# Patient Record
Sex: Male | Born: 1985 | Race: White | Hispanic: No | Marital: Single
Health system: Southern US, Community
[De-identification: ages and names within clinical notes are randomized; demographics above are authoritative.]

---

## 2021-03-27 ENCOUNTER — Emergency Department (HOSPITAL_COMMUNITY)
Admission: EM | Admit: 2021-03-27 | Discharge: 2021-03-28 | Disposition: A | Discharge status: Home / Self Care | Payer: Self-pay | Attending: Emergency Medicine | Admitting: Emergency Medicine

## 2021-03-27 ENCOUNTER — Encounter (HOSPITAL_COMMUNITY): Payer: Self-pay

## 2021-03-27 ENCOUNTER — Other Ambulatory Visit: Payer: Self-pay

## 2021-03-27 DIAGNOSIS — R4182 Altered mental status, unspecified: Secondary | ICD-10-CM | POA: Insufficient documentation

## 2021-03-27 DIAGNOSIS — Z7982 Long term (current) use of aspirin: Secondary | ICD-10-CM | POA: Insufficient documentation

## 2021-03-27 DIAGNOSIS — F1990 Other psychoactive substance use, unspecified, uncomplicated: Secondary | ICD-10-CM

## 2021-03-27 DIAGNOSIS — F1520 Other stimulant dependence, uncomplicated: Secondary | ICD-10-CM | POA: Insufficient documentation

## 2021-03-27 LAB — COMPREHENSIVE METABOLIC PANEL
ALT: 71 U/L — ABNORMAL HIGH (ref 0–44)
AST: 76 U/L — ABNORMAL HIGH (ref 15–41)
Albumin: 4.7 g/dL (ref 3.5–5.0)
Alkaline Phosphatase: 76 U/L (ref 38–126)
Anion gap: 16 — ABNORMAL HIGH (ref 5–15)
BUN: 17 mg/dL (ref 6–20)
CO2: 25 mmol/L (ref 22–32)
Calcium: 10.1 mg/dL (ref 8.9–10.3)
Chloride: 102 mmol/L (ref 98–111)
Creatinine, Ser: 1.71 mg/dL — ABNORMAL HIGH (ref 0.61–1.24)
GFR, Estimated: 54 mL/min — ABNORMAL LOW (ref >60)
Glucose, Bld: 100 mg/dL — ABNORMAL HIGH (ref 70–99)
Potassium: 3.6 mmol/L (ref 3.5–5.1)
Sodium: 143 mmol/L (ref 135–145)
Total Bilirubin: 0.9 mg/dL (ref 0.3–1.2)
Total Protein: 8.1 g/dL (ref 6.5–8.1)

## 2021-03-27 LAB — CBC WITH DIFFERENTIAL/PLATELET
Abs Immature Granulocytes: 0.07 10*3/uL (ref 0.00–0.07)
Basophils Absolute: 0.1 10*3/uL (ref 0.0–0.1)
Basophils Relative: 0 %
Eosinophils Absolute: 0 10*3/uL (ref 0.0–0.5)
Eosinophils Relative: 0 %
HCT: 48.5 % (ref 39.0–52.0)
Hemoglobin: 16.7 g/dL (ref 13.0–17.0)
Immature Granulocytes: 0 %
Lymphocytes Relative: 13 %
Lymphs Abs: 2 10*3/uL (ref 0.7–4.0)
MCH: 33 pg (ref 26.0–34.0)
MCHC: 34.4 g/dL (ref 30.0–36.0)
MCV: 95.8 fL (ref 80.0–100.0)
Monocytes Absolute: 1.3 10*3/uL — ABNORMAL HIGH (ref 0.1–1.0)
Monocytes Relative: 8 %
Neutro Abs: 12.5 10*3/uL — ABNORMAL HIGH (ref 1.7–7.7)
Neutrophils Relative %: 79 %
Platelets: 562 10*3/uL — ABNORMAL HIGH (ref 150–400)
RBC: 5.06 MIL/uL (ref 4.22–5.81)
RDW: 12.5 % (ref 11.5–15.5)
WBC: 15.9 10*3/uL — ABNORMAL HIGH (ref 4.0–10.5)
nRBC: 0 % (ref 0.0–0.2)

## 2021-03-27 LAB — SALICYLATE LEVEL: Salicylate Lvl: <7 mg/dL — ABNORMAL LOW (ref 7.0–30.0)

## 2021-03-27 LAB — ACETAMINOPHEN LEVEL: Acetaminophen (Tylenol), Serum: <10 ug/mL — ABNORMAL LOW (ref 10–30)

## 2021-03-27 MED ORDER — LORAZEPAM 2 MG/ML IJ SOLN: 2.0000 mg | Freq: Once | INTRAMUSCULAR | Status: DC

## 2021-03-27 MED ORDER — RISPERIDONE 2 MG PO TBDP
2.0000 mg | ORAL_TABLET | Freq: Every day | ORAL | Status: DC
  Filled 2021-03-27: qty 1

## 2021-03-27 MED ORDER — LORAZEPAM 1 MG PO TABS
2.0000 mg | ORAL_TABLET | Freq: Once | ORAL | Status: AC
  Administered 2021-03-27: 2 mg via ORAL
  Filled 2021-03-27: qty 2

## 2021-03-27 MED ORDER — DIPHENHYDRAMINE HCL 50 MG/ML IJ SOLN
50.0000 mg | Freq: Once | INTRAMUSCULAR | Status: AC
  Administered 2021-03-27: 50 mg via INTRAMUSCULAR
  Filled 2021-03-27: qty 1

## 2021-03-27 MED ORDER — ZIPRASIDONE MESYLATE 20 MG IM SOLR: 20.0000 mg | Freq: Once | INTRAMUSCULAR | Status: DC

## 2021-03-27 NOTE — ED Notes (Signed)
Pt given turkey sandwich and cola per RN. 

## 2021-03-27 NOTE — ED Notes (Signed)
Provider at bedside

## 2021-03-27 NOTE — ED Notes (Signed)
Belongings placed in locker 2 in purple side

## 2021-03-27 NOTE — ED Notes (Signed)
Pt up to nursing desk requesting more water and talking to staff

## 2021-03-27 NOTE — ED Notes (Signed)
Received verbal report from Brittany RN at this time 
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
## 2021-03-28 ENCOUNTER — Emergency Department (HOSPITAL_COMMUNITY)
Admission: EM | Admit: 2021-03-28 | Discharge: 2021-03-29 | Disposition: A | Discharge status: Home / Self Care | Payer: Self-pay | Attending: Emergency Medicine | Admitting: Emergency Medicine

## 2021-03-28 DIAGNOSIS — Y900 Blood alcohol level of less than 20 mg/100 ml: Secondary | ICD-10-CM | POA: Insufficient documentation

## 2021-03-28 DIAGNOSIS — F10929 Alcohol use, unspecified with intoxication, unspecified: Secondary | ICD-10-CM

## 2021-03-28 DIAGNOSIS — F1510 Other stimulant abuse, uncomplicated: Secondary | ICD-10-CM | POA: Insufficient documentation

## 2021-03-28 DIAGNOSIS — F10129 Alcohol abuse with intoxication, unspecified: Secondary | ICD-10-CM | POA: Insufficient documentation

## 2021-03-28 LAB — CBC WITH DIFFERENTIAL/PLATELET
Abs Immature Granulocytes: 0.03 10*3/uL (ref 0.00–0.07)
Basophils Absolute: 0 10*3/uL (ref 0.0–0.1)
Basophils Relative: 0 %
Eosinophils Absolute: 0.1 10*3/uL (ref 0.0–0.5)
Eosinophils Relative: 1 %
HCT: 44.6 % (ref 39.0–52.0)
Hemoglobin: 15.5 g/dL (ref 13.0–17.0)
Immature Granulocytes: 0 %
Lymphocytes Relative: 13 %
Lymphs Abs: 1.3 10*3/uL (ref 0.7–4.0)
MCH: 32.6 pg (ref 26.0–34.0)
MCHC: 34.8 g/dL (ref 30.0–36.0)
MCV: 93.9 fL (ref 80.0–100.0)
Monocytes Absolute: 0.6 10*3/uL (ref 0.1–1.0)
Monocytes Relative: 6 %
Neutro Abs: 8.5 10*3/uL — ABNORMAL HIGH (ref 1.7–7.7)
Neutrophils Relative %: 80 %
Platelets: 450 10*3/uL — ABNORMAL HIGH (ref 150–400)
RBC: 4.75 MIL/uL (ref 4.22–5.81)
RDW: 12.3 % (ref 11.5–15.5)
WBC: 10.5 10*3/uL (ref 4.0–10.5)
nRBC: 0 % (ref 0.0–0.2)

## 2021-03-28 LAB — COMPREHENSIVE METABOLIC PANEL
ALT: 60 U/L — ABNORMAL HIGH (ref 0–44)
AST: 61 U/L — ABNORMAL HIGH (ref 15–41)
Albumin: 4.4 g/dL (ref 3.5–5.0)
Alkaline Phosphatase: 71 U/L (ref 38–126)
Anion gap: 13 (ref 5–15)
BUN: 21 mg/dL — ABNORMAL HIGH (ref 6–20)
CO2: 24 mmol/L (ref 22–32)
Calcium: 8.9 mg/dL (ref 8.9–10.3)
Chloride: 102 mmol/L (ref 98–111)
Creatinine, Ser: 1.23 mg/dL (ref 0.61–1.24)
GFR, Estimated: >60 mL/min (ref >60)
Glucose, Bld: 100 mg/dL — ABNORMAL HIGH (ref 70–99)
Potassium: 3.5 mmol/L (ref 3.5–5.1)
Sodium: 139 mmol/L (ref 135–145)
Total Bilirubin: 1.2 mg/dL (ref 0.3–1.2)
Total Protein: 7.8 g/dL (ref 6.5–8.1)

## 2021-03-28 LAB — CBG MONITORING, ED: Glucose-Capillary: 124 mg/dL — ABNORMAL HIGH (ref 70–99)

## 2021-03-28 LAB — ETHANOL: Alcohol, Ethyl (B): <10 mg/dL (ref <10)

## 2021-03-28 MED ORDER — TETANUS-DIPHTH-ACELL PERTUSSIS 5-2.5-18.5 LF-MCG/0.5 IM SUSY: 0.5000 mL | PREFILLED_SYRINGE | Freq: Once | INTRAMUSCULAR | Status: DC

## 2021-03-28 NOTE — ED Notes (Signed)
Moved to ed 21 to have psych call visit
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
## 2021-03-28 NOTE — ED Notes (Signed)
Pt  Belongings returned and pt was provided paper scrubs per request. Given lunch bag and drink for discharge. Pt alert and oriented X4. Ambulatory with steady gait to lobby.
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
## 2021-03-28 NOTE — ED Notes (Signed)
Moved back to h15 at this time

## 2021-03-28 NOTE — Discharge Instructions (Signed)
You were seen today for altered mental status.  You were brought in by police because of bizarre behaviors. We believe this is likely in the setting of your multiple substance use.  We have attached information on drug use and have attached resources for you to follow-up should you wish to pursue addiction therapies. Please return with any fevers or chills, nausea or vomiting, syncope or chest pain.  Thank you for the opportunity to participate in your care.

## 2021-03-28 NOTE — ED Notes (Signed)
Pt informed of rights of MSE, pt unable to sign at this time due to signature pad not working.
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
## 2021-03-29 ENCOUNTER — Emergency Department (HOSPITAL_COMMUNITY): Payer: Self-pay

## 2021-03-29 LAB — ACETAMINOPHEN LEVEL: Acetaminophen (Tylenol), Serum: <10 ug/mL — ABNORMAL LOW (ref 10–30)

## 2021-03-29 LAB — SALICYLATE LEVEL: Salicylate Lvl: <7 mg/dL — ABNORMAL LOW (ref 7.0–30.0)

## 2021-03-29 MED ORDER — IBUPROFEN 200 MG PO TABS
600.0000 mg | ORAL_TABLET | Freq: Once | ORAL | Status: AC
  Administered 2021-03-29: 600 mg via ORAL
  Filled 2021-03-29: qty 3

## 2021-03-29 NOTE — Discharge Instructions (Addendum)
You were evaluated in the Emergency Department and after careful evaluation, we did not find any emergent condition requiring admission or further testing in the hospital.  Your exam/testing today was overall reassuring.  Please return to the Emergency Department if you experience any worsening of your condition.  Thank you for allowing us to be a part of your care.  

## 2021-03-29 NOTE — ED Notes (Signed)
Pt given ice pack for ankle and phone number for [facility] per request. Pt also give blue paper scrubs and clean socks.

## 2021-03-29 NOTE — ED Notes (Signed)
Discharge papers gone over with patient, patient provided with outpatient resources for homeless shelters and behavioral health resources. Patient states he went to stand up to get and his ankle hurts and states he thinks he injured it earlier in the night. Dr. Pilar Plate notified.

## 2021-05-21 ENCOUNTER — Encounter (HOSPITAL_COMMUNITY): Payer: Self-pay

## 2021-05-21 ENCOUNTER — Emergency Department (HOSPITAL_COMMUNITY)
Admission: EM | Admit: 2021-05-21 | Discharge: 2021-05-21 | Discharge status: Against Medical Advice | Payer: Self-pay | Attending: Emergency Medicine | Admitting: Emergency Medicine

## 2021-05-21 DIAGNOSIS — R000 Tachycardia, unspecified: Secondary | ICD-10-CM | POA: Insufficient documentation

## 2021-05-21 DIAGNOSIS — F1910 Other psychoactive substance abuse, uncomplicated: Secondary | ICD-10-CM | POA: Insufficient documentation

## 2021-05-21 LAB — CBC WITH DIFFERENTIAL/PLATELET
Abs Immature Granulocytes: 0.08 10*3/uL — ABNORMAL HIGH (ref 0.00–0.07)
Basophils Absolute: 0.1 10*3/uL (ref 0.0–0.1)
Basophils Relative: 0 %
Eosinophils Absolute: 0 10*3/uL (ref 0.0–0.5)
Eosinophils Relative: 0 %
HCT: 46 % (ref 39.0–52.0)
Hemoglobin: 15.7 g/dL (ref 13.0–17.0)
Immature Granulocytes: 0 %
Lymphocytes Relative: 7 %
Lymphs Abs: 1.4 10*3/uL (ref 0.7–4.0)
MCH: 31.8 pg (ref 26.0–34.0)
MCHC: 34.1 g/dL (ref 30.0–36.0)
MCV: 93.1 fL (ref 80.0–100.0)
Monocytes Absolute: 2.3 10*3/uL — ABNORMAL HIGH (ref 0.1–1.0)
Monocytes Relative: 12 %
Neutro Abs: 15.9 10*3/uL — ABNORMAL HIGH (ref 1.7–7.7)
Neutrophils Relative %: 81 %
Platelets: 352 10*3/uL (ref 150–400)
RBC: 4.94 MIL/uL (ref 4.22–5.81)
RDW: 12.4 % (ref 11.5–15.5)
WBC: 19.7 10*3/uL — ABNORMAL HIGH (ref 4.0–10.5)
nRBC: 0 % (ref 0.0–0.2)

## 2021-05-21 LAB — RAPID URINE DRUG SCREEN, HOSP PERFORMED
Amphetamines: POSITIVE — AB
Barbiturates: NOT DETECTED
Benzodiazepines: NOT DETECTED
Cocaine: POSITIVE — AB
Opiates: NOT DETECTED
Tetrahydrocannabinol: POSITIVE — AB

## 2021-05-21 LAB — BASIC METABOLIC PANEL
Anion gap: 13 (ref 5–15)
BUN: 19 mg/dL (ref 6–20)
CO2: 24 mmol/L (ref 22–32)
Calcium: 9.5 mg/dL (ref 8.9–10.3)
Chloride: 105 mmol/L (ref 98–111)
Creatinine, Ser: 1.33 mg/dL — ABNORMAL HIGH (ref 0.61–1.24)
GFR, Estimated: >60 mL/min (ref >60)
Glucose, Bld: 99 mg/dL (ref 70–99)
Potassium: 3.7 mmol/L (ref 3.5–5.1)
Sodium: 142 mmol/L (ref 135–145)

## 2021-05-21 LAB — ACETAMINOPHEN LEVEL: Acetaminophen (Tylenol), Serum: <10 ug/mL — ABNORMAL LOW (ref 10–30)

## 2021-05-21 LAB — ETHANOL: Alcohol, Ethyl (B): <10 mg/dL (ref <10)

## 2021-05-21 LAB — SALICYLATE LEVEL: Salicylate Lvl: <7 mg/dL — ABNORMAL LOW (ref 7.0–30.0)

## 2021-05-21 NOTE — ED Notes (Addendum)
Changed into burgundy scrubs-- belongings collected and placed in 5-8 cabinet.

## 2021-05-21 NOTE — ED Notes (Addendum)
Pt seen by other staff members ambulating out of ED with steady gait. MD Messick aware. Pt did not verbalize why he was leaving. Pts belongings still locked in pt belongings cabinet 5-8.

## 2021-05-21 NOTE — ED Notes (Addendum)
Pt seen by security running away from [facility]. MD Messick aware. Not present in ED to receive AVS.
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
## 2021-05-21 NOTE — ED Notes (Signed)
Refusing to keep bp cuff , spo2 or cardiac leads on
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
## 2021-05-21 NOTE — ED Notes (Addendum)
Patient is extremely restless and is walking up and down the hallway and around the unit. Staff has asked pt to wait in his room, but pt states "I need to be discharged, I have stuff to do before nighttime." MD and RN aware.

## 2021-05-23 ENCOUNTER — Emergency Department (HOSPITAL_COMMUNITY)
Admission: EM | Admit: 2021-05-23 | Discharge: 2021-05-23 | Discharge status: Against Medical Advice | Payer: Self-pay | Attending: Emergency Medicine | Admitting: Emergency Medicine

## 2021-05-23 DIAGNOSIS — F4312 Post-traumatic stress disorder, chronic: Secondary | ICD-10-CM | POA: Insufficient documentation

## 2021-05-23 DIAGNOSIS — Z5321 Procedure and treatment not carried out due to patient leaving prior to being seen by health care provider: Secondary | ICD-10-CM | POA: Insufficient documentation
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
## 2021-05-23 NOTE — ED Notes (Signed)
Pt seen walking out lobby front doors by screeners stating he was leaving.

## 2021-05-23 NOTE — ED Notes (Signed)
Pt walked out of the room and states he needs to use the restroom, staff directed patient to where the restroom was. Pt states he needs to have water to wash his hands, which he was informed was available in the bathroom. Pt then states he needs to feel comfortable and wanted to go to the lobby bathroom to have a bowel movement and then walked out to the lobby.

## 2021-05-23 NOTE — ED Notes (Signed)
Pt walks back through triage doors and asking to sleep in room 5. Explained to patient again the process of triage and being evaluated. Pt walked back into lobby.

## 2021-05-23 NOTE — ED Notes (Signed)
Attempting to triage patient, pt asking for a bed to sleep in. Explained that if he wants to check in as a patient that he will need to go through the triage process to be assessed and evaluated. Pt states he just wants to lay down and use a phone. Explained to patient that this is an emergency room and he can check in to receive a medical screening exam by a provider. Pt states she does want to check in and walks back out to lobby.

## 2021-07-12 ENCOUNTER — Other Ambulatory Visit: Payer: Self-pay

## 2021-07-12 ENCOUNTER — Emergency Department (HOSPITAL_COMMUNITY): Payer: Self-pay

## 2021-07-12 ENCOUNTER — Emergency Department (HOSPITAL_COMMUNITY)
Admission: EM | Admit: 2021-07-12 | Discharge: 2021-07-12 | Disposition: A | Discharge status: Home / Self Care | Payer: Self-pay | Attending: Emergency Medicine | Admitting: Emergency Medicine

## 2021-07-12 DIAGNOSIS — M79641 Pain in right hand: Secondary | ICD-10-CM | POA: Insufficient documentation

## 2021-07-12 DIAGNOSIS — M25522 Pain in left elbow: Secondary | ICD-10-CM | POA: Insufficient documentation

## 2021-07-12 DIAGNOSIS — S0011XA Contusion of right eyelid and periocular area, initial encounter: Secondary | ICD-10-CM | POA: Insufficient documentation

## 2021-07-12 DIAGNOSIS — S0083XA Contusion of other part of head, initial encounter: Secondary | ICD-10-CM | POA: Insufficient documentation

## 2021-07-12 DIAGNOSIS — T148XXA Other injury of unspecified body region, initial encounter: Secondary | ICD-10-CM

## 2021-07-12 MED ORDER — ACETAMINOPHEN 500 MG PO TABS
1000.0000 mg | ORAL_TABLET | Freq: Once | ORAL | Status: AC
  Administered 2021-07-12: 1000 mg via ORAL
  Filled 2021-07-12: qty 2
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
## 2021-07-12 NOTE — Discharge Instructions (Addendum)
Your imaging today was reassuring did not show any thing acutely broken.  You do have some hematomas noted on your head as well as a contusion to your face.  Your head CT did show evidence that you possibly may have had a small injury to your head that is old. You can follow up with neurology with this as needed.   You can apply ice to help with pain and swelling.  Can use the arm sling as needed for support and stabilization.  Follow the RICE (Rest, Ice, Compression, Elevation) protocol as directed.

## 2022-01-09 ENCOUNTER — Emergency Department (HOSPITAL_COMMUNITY)
Admission: EM | Admit: 2022-01-09 | Discharge status: Against Medical Advice | Payer: Self-pay | Source: Home / Self Care

## 2022-01-10 ENCOUNTER — Other Ambulatory Visit: Payer: Self-pay

## 2022-01-10 ENCOUNTER — Emergency Department (HOSPITAL_COMMUNITY)
Admission: EM | Admit: 2022-01-10 | Discharge: 2022-01-10 | Disposition: A | Discharge status: Home / Self Care | Payer: Self-pay | Attending: Emergency Medicine | Admitting: Emergency Medicine

## 2022-01-10 ENCOUNTER — Encounter (HOSPITAL_COMMUNITY): Payer: Self-pay | Admitting: Emergency Medicine

## 2022-01-10 DIAGNOSIS — Z5900 Homelessness unspecified: Secondary | ICD-10-CM | POA: Insufficient documentation

## 2022-01-10 DIAGNOSIS — F32A Depression, unspecified: Secondary | ICD-10-CM | POA: Insufficient documentation

## 2022-01-10 DIAGNOSIS — B353 Tinea pedis: Secondary | ICD-10-CM | POA: Insufficient documentation

## 2022-01-10 MED ORDER — CLOTRIMAZOLE 1 % EX CREA
TOPICAL_CREAM | Freq: Two times a day (BID) | CUTANEOUS | Status: DC
  Filled 2022-01-10: qty 15

## 2022-01-10 NOTE — ED Notes (Signed)
Pt feet soaked, cleaned and dried.  Lotromin applied. Clean dry socks applied along with post op shoes.

## 2022-01-10 NOTE — Discharge Instructions (Addendum)
Keep feet dry.  Use lotrimin cream      Outpatient Psychiatry and Counseling  [facility]: Mobile Crisis Management:  [phone]  [facility]         [address] [place], [place] [address] [phone]  [facility] [employer] sliding scale fee and walk in schedule: M-F 8am-12pm/1pm-3pm [address] [place], [place] [address] [phone]  [facility] Outpatient Services/ Intensive Outpatient Therapy Program [address] [place], [place] [address] [phone]  [facility]   [facility] [address]   [address] [place], [place] [address]    [place], [place] [address] [phone]     [phone]  [facility] [address]  [address] [place] [address]    [place] [address] [phone]     [phone]  Andee Poles, MD    [facility] [address]    [address] [place] [address]    [place] [address] [phone]       [phone]  [facility]   [facility] [address]   [address] [place], [place] [phone]     [phone]  [facility]    [facility] [phone] [address] [place], [place]                  [address] [phone]     [place], [place] [address] [phone] [facility]: (Spanish speakin) [phone]  [facility]    [facility] [address]    [address] [place], [place] [address]    [place], [place] [address] [phone]     [phone]                  Intensive Outpatient Programs  [facility]    [facility] [address]     [address] [place],  [place]     [place], [place] [phone]      [phone]  [facility]   [facility]  (Inpatient and outpatient)  [phone] (Suboxone and Methadone) [address]           [phone]           [facility]    [facility] - Intensive Outpatient [address]     [address] [place], [place] [address]     [place], [place]  [phone]      [phone]  [facility] (Outpatient, Inpatient, [facility]  [facility] (Groups and Residental) (insurance only) [phone]    [place], [place]          [phone]       [facility]    [facility] (for caregivers and family) [address]     [address] [place], [place]     [place], [place] [phone]      [phone]  Residential Treatment Programs  [facility]  [place](2 years) Residential: 90 days)  [facility] [address] [place]  [address] [place], [place]     [place], [place] [phone]      [phone] or [phone]  [facility]    [facility] [address]      [address] [place], [place]     [place], [place] [phone]      [phone]  [facility]   [facility] ([facility]) [address]     [address] [place], [place] [address]     [place], [place] [phone]      [phone] Admissions: 8am-3pm M-F  [facility]: Residential Program [phone] Days)              [facility]: [facility]  [place], [place]     [place], [place]  [phone] or [phone]    (Walk in Hours over the weekend or by referral)   [facility]: [facility]:[phone] (for crisis response 24 hours a day)

## 2022-01-10 NOTE — Progress Notes (Signed)
CSW received consult for substance use resources and when speaking with PA noted concerns regarding homelessness and shoes not fitting. CSW checked and noted patient is a size 13 but only had size 11 in the clothing closet. CSW met with patient and introduced herself and role. 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 Patient identifies the lack of nearby family has exasperated his difficulties. Patient additionally reports he has been being followed by other people for some time and reported it's not one specific person or group. Patient denied any auditory hallucinations and reported he does see ghosts. Patient when clarified reported he does recall seeing it once while sober. Patient reports he is currently involved with sober living of [place] which conflicts with him reporting his current homeless status (CSW unaware if they have an outpatient only program). Patient started to drift off reporting he has not had a good nights sleep in several days and started to not participate in conversation. CSW added BH and SUD resources to AVS for patient. CSW inquired if he needed other clothes or things and patient did not respond. CSW was contacted by RN after meeting with patient and provided update.

## 2022-01-10 NOTE — ED Triage Notes (Signed)
Pt reports bilat foot pain x 3-4 days, states, "My boots don't fit right."

## 2022-01-10 NOTE — ED Provider Notes (Signed)
[facility] EMERGENCY DEPARTMENT Provider Note   CSN: [id number] Arrival date & time: 01/10/22  2585     History  Chief Complaint  Patient presents with   Foot Pain    Kyle Odom is a 36 y.o. male.  Pt reports his boots are to small and he blistered his feet,  Pt reports feet have gotten wet from sweating.  Pt also request to talk to a Child psychotherapist about treatment for depression.  Pt is homeless.  Pt [facility] to [facility].    The history is provided by the patient. No language interpreter was used.  Foot Pain This is a new problem. The current episode started 2 days ago. The problem occurs constantly. Pertinent negatives include no chest pain. Nothing aggravates the symptoms. Nothing relieves the symptoms. He has tried nothing for the symptoms. The treatment provided no relief.      Home Medications Prior to Admission medications   Medication Sig Start Date End Date Taking? Authorizing Provider  ibuprofen (ADVIL) 200 MG tablet Take 400-600 mg by mouth every 6 (six) hours as needed for fever, headache or mild pain.    [provider]      Allergies    Patient has no known allergies.    Review of Systems   Review of Systems  Cardiovascular:  Negative for chest pain.  All other systems reviewed and are negative.  Physical Exam Updated Vital Signs BP 115/75    Pulse 80    Temp 98.1 F (36.7 C)    Resp 18    Ht 6\' 1"  (1.854 m)    Wt 108.9 kg    SpO2 100%    BMI 31.66 kg/m  Physical Exam Vitals reviewed.  Constitutional:      Appearance: Normal appearance.  HENT:     Head: Normocephalic.     Mouth/Throat:     Mouth: Mucous membranes are moist.  Cardiovascular:     Rate and Rhythm: Normal rate.  Pulmonary:     Effort: Pulmonary effort is normal.  Musculoskeletal:        General: Swelling and tenderness present.     Comments: Feet wet, white blisters,  white blisters between toes   Skin:    General: Skin is warm.  Neurological:      General: No focal deficit present.     Mental Status: He is alert.  Psychiatric:        Mood and Affect: Mood normal.    ED Results / Procedures / Treatments   Labs (all labs ordered are listed, but only abnormal results are displayed) Labs Reviewed - No data to display  EKG None  Radiology No results found.  Procedures Procedures    Medications Ordered in ED Medications  clotrimazole (LOTRIMIN) 1 % cream ( Topical Given 01/10/22 1125)    ED Course/ Medical Decision Making/ A&P                           Medical Decision Making Problems Addressed: Depression, unspecified depression type: chronic illness or injury with exacerbation, progression, or side effects of treatment    Details: Socail work gave pt resources Tinea pedis of both feet: acute illness or injury  Risk OTC drugs. Prescription drug management.   RN soaked pt feet, dried and applied lotrimin.  Pt bandaged, put in dry socks and post op shoes.  Pt advised to let boots dry out.  Final Clinical Impression(s) / ED Diagnoses Final diagnoses:  Tinea pedis of both feet  Depression, unspecified depression type    Rx / DC Orders ED Discharge Orders     None     An After Visit Summary was printed and given to the patient.     Elson Areas, PA-C 01/10/22 1225    Virgina Norfolk, DO 01/10/22 1500

## 2022-01-10 NOTE — ED Notes (Signed)
Patient refusing to stay awake and keeps falling back asleep when telling him he is discharged.

## 2022-04-20 IMAGING — DX DG ANKLE COMPLETE 3+V*L*
3 series · 3 of 3 positions shown · non-contrast
Comparison: None

CLINICAL DATA: Left ankle pain

EXAM:
LEFT ANKLE COMPLETE - 3+ VIEW

[ankle ap]
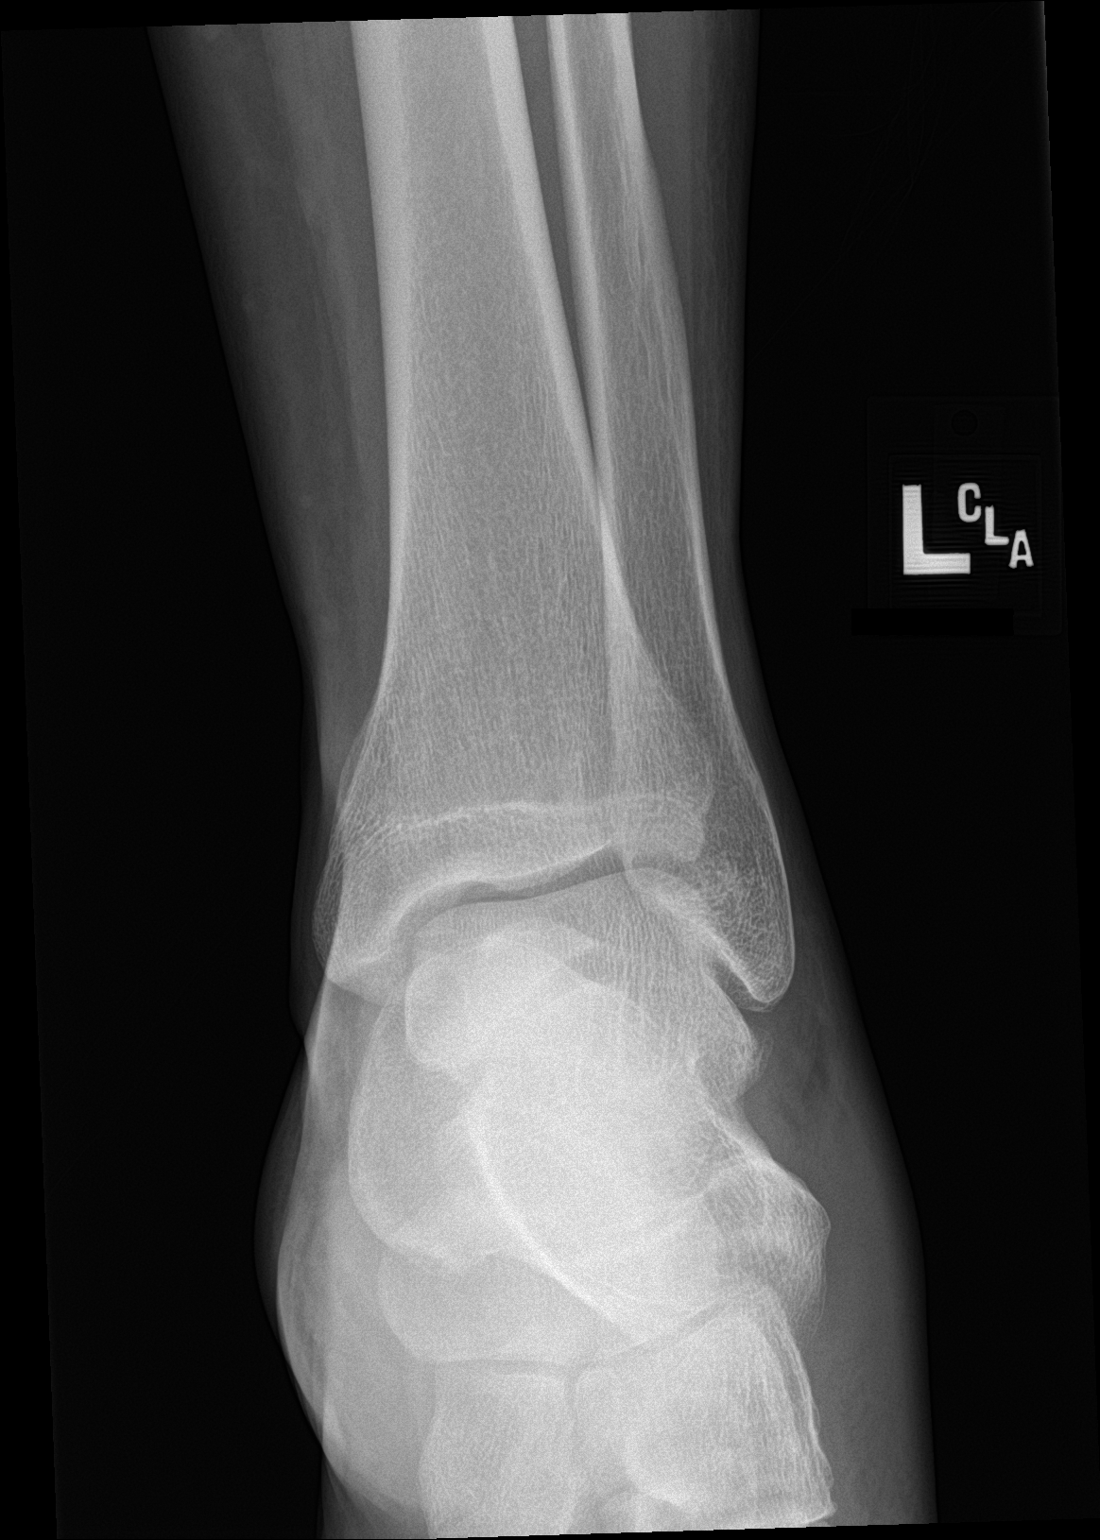

[ankle lat]
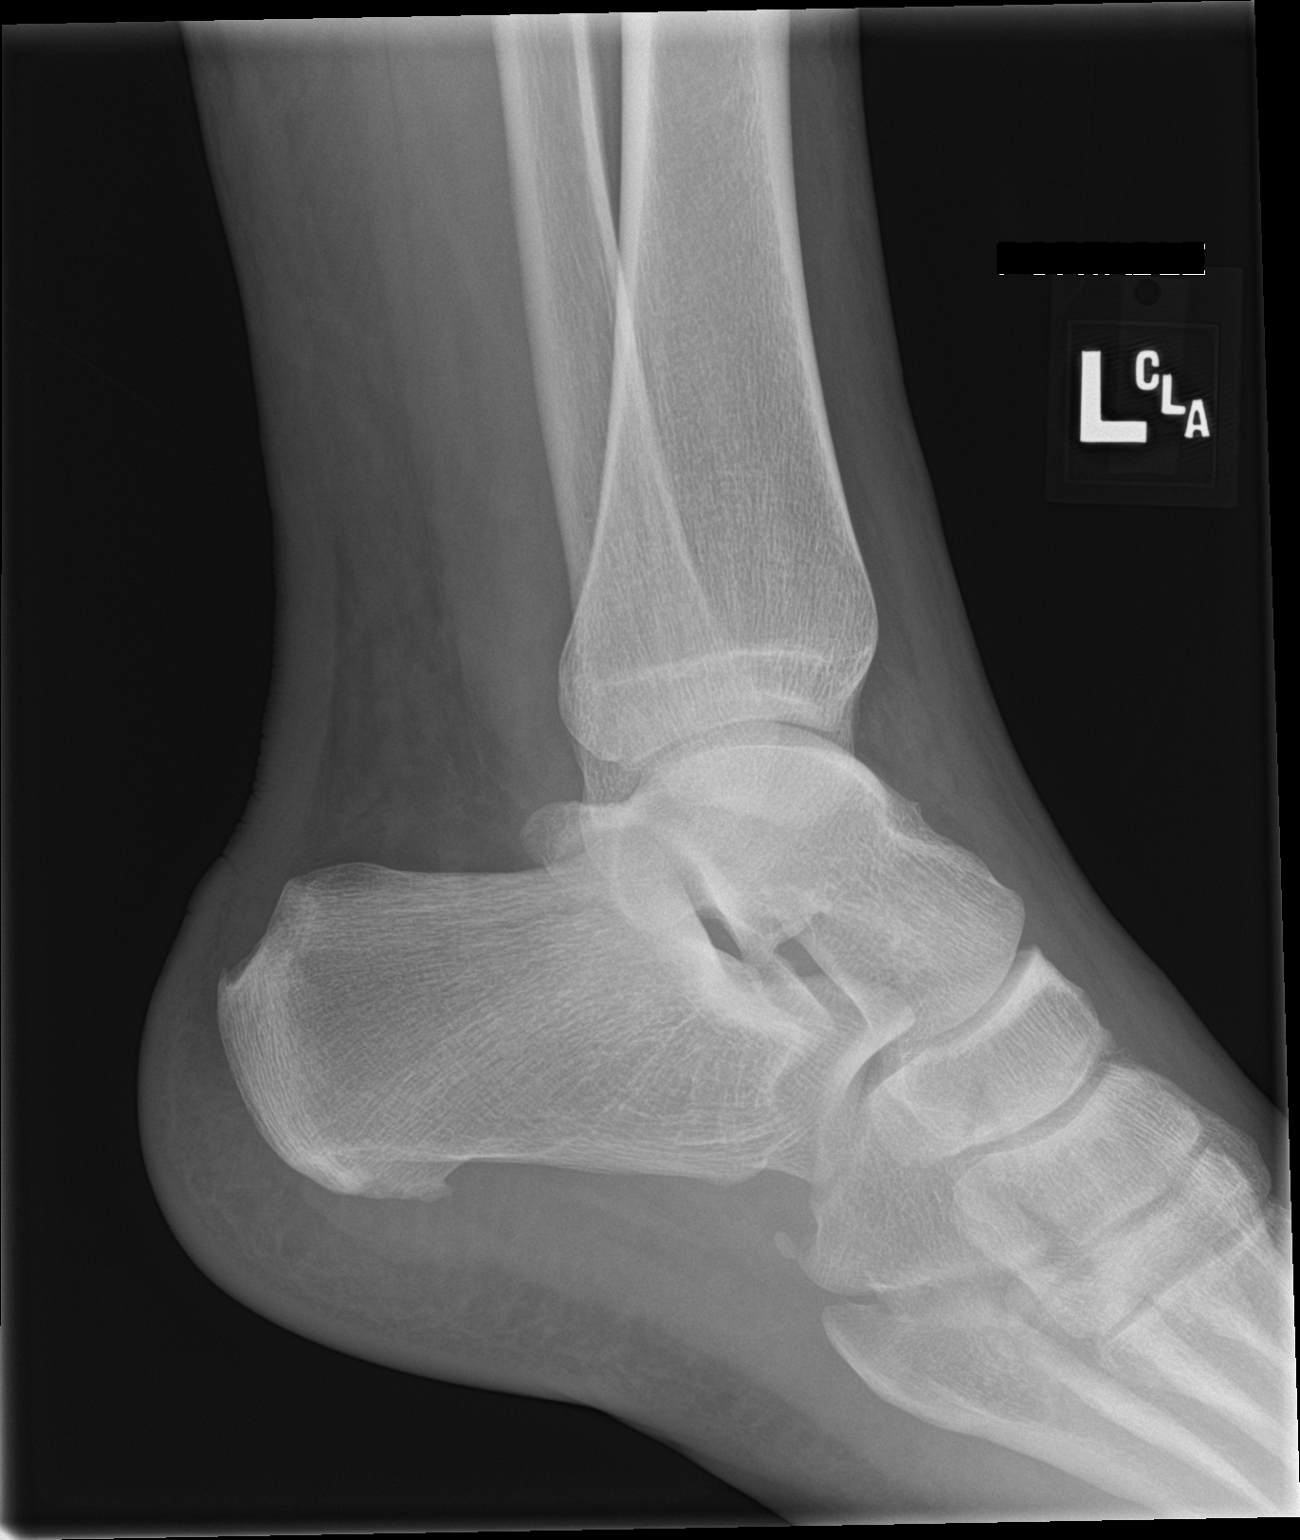

[ankle obl]
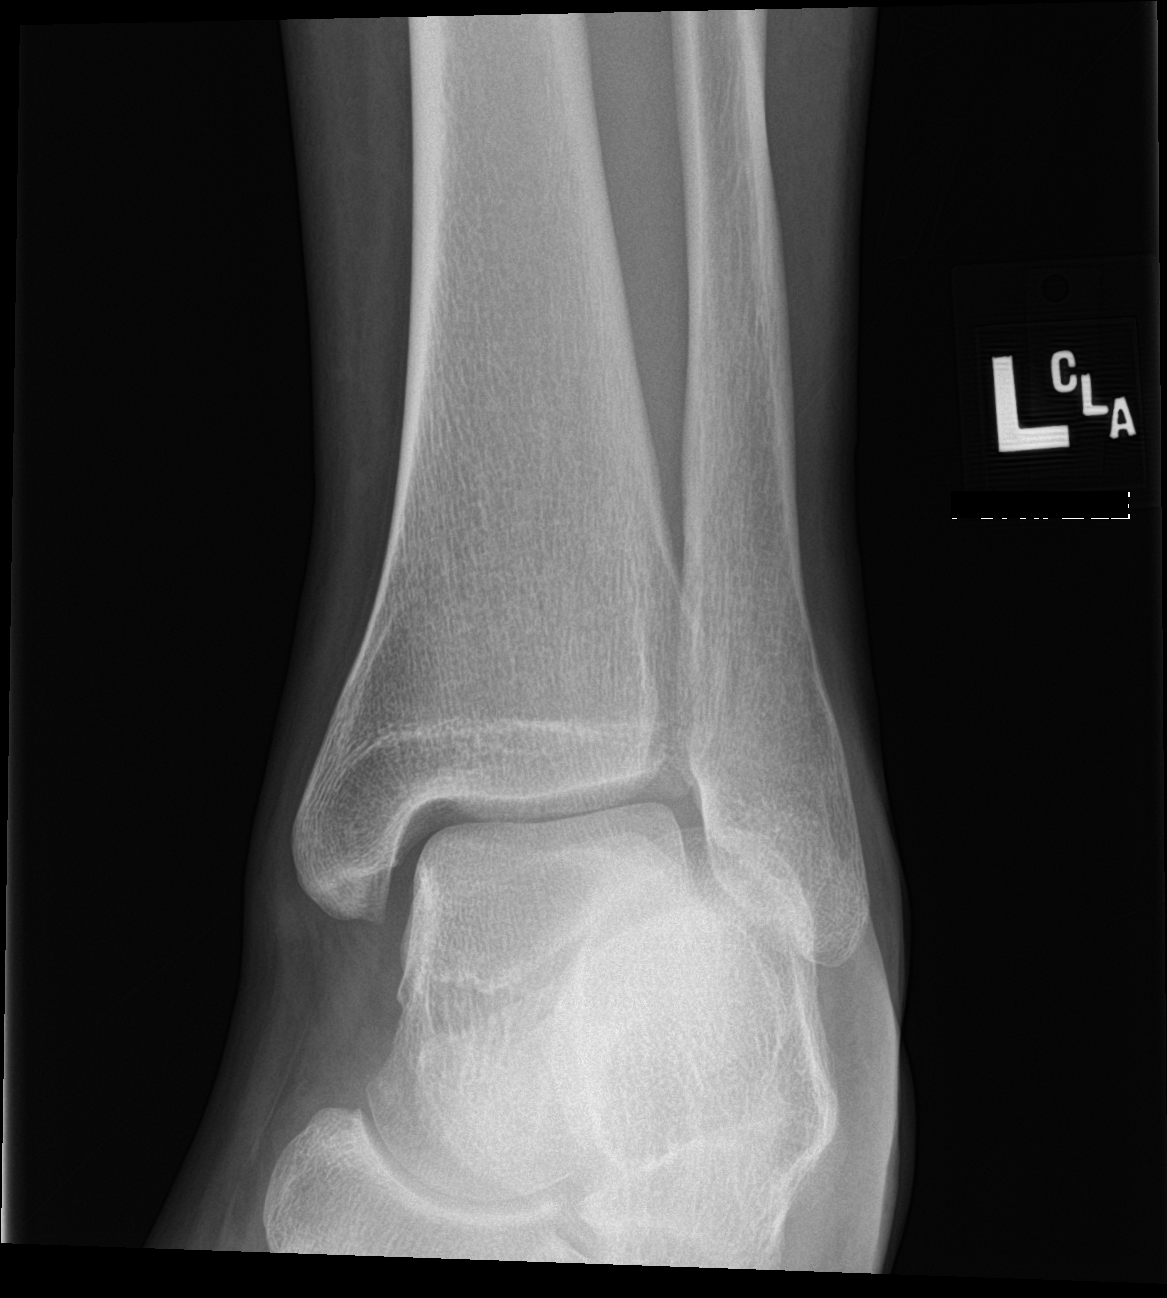

[3 of 3 positions shown; findings below may reference images not displayed]

FINDINGS: Mild lateral soft tissue swelling. No sizable ankle joint effusion.
No acute bony abnormality. Specifically, no fracture, subluxation,
or dislocation. Bidirectional calcaneal spurring. Corticated os
peroneum
IMPRESSION: Mild lateral soft tissue swelling. No acute osseous abnormality.
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
